# Patient Record
Sex: Female | Born: 1985 | Race: White | Hispanic: No | Marital: Married | Smoking: Former smoker
Health system: Southern US, Community
[De-identification: ages and names within clinical notes are randomized; demographics above are authoritative.]

## PROBLEM LIST (undated history)

## (undated) DIAGNOSIS — R519 Headache, unspecified: Secondary | ICD-10-CM

## (undated) DIAGNOSIS — R51 Headache: Secondary | ICD-10-CM

## (undated) HISTORY — PX: ECTOPIC PREGNANCY SURGERY: SHX613

## (undated) HISTORY — PX: TONSILLECTOMY: SUR1361

## (undated) HISTORY — DX: Headache: R51

## (undated) HISTORY — DX: Headache, unspecified: R51.9

---

## 2015-05-13 ENCOUNTER — Ambulatory Visit (INDEPENDENT_AMBULATORY_CARE_PROVIDER_SITE_OTHER): Payer: 59 | Admitting: Family Medicine

## 2015-05-13 ENCOUNTER — Encounter: Payer: Self-pay | Admitting: Family Medicine

## 2015-05-13 VITALS — BP 116/74 | HR 75 | Temp 98.2°F | Ht 62.0 in | Wt 134.8 lb

## 2015-05-13 DIAGNOSIS — G43009 Migraine without aura, not intractable, without status migrainosus: Secondary | ICD-10-CM | POA: Diagnosis not present

## 2015-05-13 DIAGNOSIS — F419 Anxiety disorder, unspecified: Secondary | ICD-10-CM

## 2015-05-13 DIAGNOSIS — G43909 Migraine, unspecified, not intractable, without status migrainosus: Secondary | ICD-10-CM | POA: Insufficient documentation

## 2015-05-13 MED ORDER — SERTRALINE HCL 50 MG PO TABS: ORAL_TABLET | ORAL | Status: DC

## 2015-05-13 MED ORDER — BUTALBITAL-APAP-CAFFEINE 50-325-40 MG PO TABS: 1.0000 | ORAL_TABLET | Freq: Two times a day (BID) | ORAL | Status: DC | PRN

## 2015-05-13 NOTE — Assessment & Plan Note (Signed)
Patient's headaches with migrainous and tension components. Neurologically intact. Suspect mild dizziness is related to migrainous nature. Notes headaches have been unchanged. No role for imaging at this time. Discussed possible treatment regimens for this. Discussed Imitrex, though with her starting on Zoloft this was not felt to be a great option due to risk of serotonin syndrome. She has responded in the past of Fioricet as an abortive therapy. We will start this on an as-needed basis. If she begins to need this more frequently she will let us know to consider alternative treatment. Given return precautions.

## 2015-05-13 NOTE — Assessment & Plan Note (Signed)
Patient with significant anxiety. No depression. No SI or HI. Discussed multiple treatment methods. We opted for SSRI, specifically Zoloft. She will take this as outlined below. She'll continue to monitor symptoms. I did advise that it could take at least 4-6 weeks for her to notice a difference. She is given return precautions. She'll follow-up in 4 weeks.

## 2015-05-13 NOTE — Progress Notes (Signed)
Patient ID: Elizabeth SandhoffSamantha Robbins, female   DOB: 07/31/1985, 30 y.o.   MRN: [id number]  Elizabeth AlarEric Luz Mares, MD Phone: [phone]  Elizabeth SandhoffSamantha Robbins is a 30 y.o. female who presents today for new patient visit.  Anxiety: Patient notes long history of anxiety. Worsened recently. She quit smoking in May 2016 then was placed on Wellbutrin. This helped significantly with her anxiety at that time. It helped relax her. She subsequently had switched to Chantix to help her quit smoking as her insurance would not cover Wellbutrin and her anxiety came back. She notes several life stressors recently. They moved, bought a house, and her husband started a new business of which he is now going to "demolish." Notes lots of changes in her life. She notes all day everyday she has an anxious feeling. No depression. No SI or HI.  Headaches: Patient has a long history of headaches. She notes headaches going back to when she was 30 years old. She was on Imitrex at that time. Notes she maybe grew out of her headaches at 30 years old though they have come back over the years. She was previously evaluated by her prior physician who tried a muscle relaxer at night that did not help with her headaches though helped with her sleep. States her headaches are stress induced. Notes a throbbing sensation on the top of her head. Positive photophobia and phonophobia. No numbness, weakness, or vision changes with this. Some nausea with this. Mild dizziness as well. Notes headaches go asleep and a heating pad. Gets headaches about once a week. She has previously been on Imitrex and Fioricet for this. She's also tried ibuprofen recently which has not been very beneficial.  Active Ambulatory Problems    Diagnosis Date Noted  . Anxiety 05/13/2015  . Migraines 05/13/2015   Resolved Ambulatory Problems    Diagnosis Date Noted  . No Resolved Ambulatory Problems   Past Medical History  Diagnosis Date  . Frequent headaches     Family History    Problem Relation Age of Onset  . Drug abuse Mother   . Breast cancer      Grandparent  . Lung cancer      Grandparent  . Stroke      Grandparent  . Hypertension      Grandparent  . Diabetes      Grandparent    Social History   Social History  . Marital Status: Married    Spouse Name: N/A  . Number of Children: N/A  . Years of Education: N/A   Occupational History  . Not on file.   Social History Main Topics  . Smoking status: Former Games developermoker  . Smokeless tobacco: Not on file  . Alcohol Use: 0.0 oz/week    0 Standard drinks or equivalent per week  . Drug Use: No  . Sexual Activity: Not on file   Other Topics Concern  . Not on file   Social History Narrative  . No narrative on file    ROS   General:  Negative for nexplained weight loss, fever Skin: Negative for new or changing mole, sore that won't heal HEENT: Negative for trouble hearing, trouble seeing, ringing in ears, mouth sores, hoarseness, change in voice, dysphagia. CV:  Negative for chest pain, dyspnea, edema, palpitations Resp: Negative for cough, dyspnea, hemoptysis GI: Negative for nausea, vomiting, diarrhea, constipation, abdominal pain, melena, hematochezia. GU: Negative for dysuria, incontinence, urinary hesitance, hematuria, vaginal or penile discharge, polyuria, sexual difficulty, lumps in testicle or breasts MSK:  Negative for muscle cramps or aches, joint pain or swelling Neuro: Positive for headaches, dizziness, Negative for weakness, numbness, passing out/fainting Psych: Positive for anxiety, stress, Negative for depression, memory problems  Objective  Physical Exam Filed Vitals:   05/13/15 0820  BP: 116/74  Pulse: 75  Temp: 98.2 F (36.8 C)    BP Readings from Last 3 Encounters:  05/13/15 116/74   Wt Readings from Last 3 Encounters:  05/13/15 134 lb 12.8 oz (61.145 kg)    Physical Exam  Constitutional: She is well-developed, well-nourished, and in no distress.  HENT:  Head:  Normocephalic and atraumatic.  Right Ear: External ear normal.  Left Ear: External ear normal.  Mouth/Throat: Oropharynx is clear and moist. No oropharyngeal exudate.  Eyes: Conjunctivae are normal. Pupils are equal, round, and reactive to light.  Neck: Neck supple.  Cardiovascular: Normal rate, regular rhythm and normal heart sounds.  Exam reveals no gallop and no friction rub.   No murmur heard. Pulmonary/Chest: Effort normal and breath sounds normal. No respiratory distress. She has no wheezes. She has no rales.  Abdominal: Soft. Bowel sounds are normal. She exhibits no distension. There is no tenderness. There is no rebound and no guarding.  Musculoskeletal: She exhibits no edema.  Lymphadenopathy:    She has no cervical adenopathy.  Neurological: She is alert.  CN 2-12 intact, 5/5 strength in bilateral biceps, triceps, grip, quads, hamstrings, plantar and dorsiflexion, sensation to light touch intact in bilateral UE and LE, normal gait, 2+ patellar reflexes, negative Romberg, no pronator drift  Skin: Skin is warm and dry. She is not diaphoretic.  Psychiatric:  Mood anxious, affect anxious     Assessment/Plan:   Anxiety Patient with significant anxiety. No depression. No SI or HI. Discussed multiple treatment methods. We opted for SSRI, specifically Zoloft. She will take this as outlined below. She'll continue to monitor symptoms. I did advise that it could take at least 4-6 weeks for her to notice a difference. She is given return precautions. She'll follow-up in 4 weeks.  Migraines Patient's headaches with migrainous and tension components. Neurologically intact. Suspect mild dizziness is related to migrainous nature. Notes headaches have been unchanged. No role for imaging at this time. Discussed possible treatment regimens for this. Discussed Imitrex, though with her starting on Zoloft this was not felt to be a great option due to risk of serotonin syndrome. She has responded in  the past of Fioricet as an abortive therapy. We will start this on an as-needed basis. If she begins to need this more frequently she will let us know to consider alternative treatment. Given return precautions.    Elizabeth Alar, MD [facility]

## 2015-05-13 NOTE — Patient Instructions (Signed)
Nice to meet you. We'll start you on Zoloft for your anxiety. We will start you on Fioricet for her headaches. Take this at the first sign of headache. If your headaches do not improve with this please let us know. If you develop worsening anxiety, depression, thoughts of harming herself or others, change in her headaches, persistent dizziness, numbness, weakness, vision changes, or any new or changing symptoms please seek medical attention.

## 2015-05-13 NOTE — Progress Notes (Signed)
Pre visit review using our clinic review tool, if applicable. No additional management support is needed unless otherwise documented below in the visit note. 

## 2015-06-17 ENCOUNTER — Ambulatory Visit (INDEPENDENT_AMBULATORY_CARE_PROVIDER_SITE_OTHER): Payer: 59 | Admitting: Family Medicine

## 2015-06-17 ENCOUNTER — Encounter: Payer: Self-pay | Admitting: Family Medicine

## 2015-06-17 VITALS — BP 112/76 | HR 107 | Temp 98.3°F | Ht 62.0 in | Wt 134.0 lb

## 2015-06-17 DIAGNOSIS — F419 Anxiety disorder, unspecified: Secondary | ICD-10-CM | POA: Diagnosis not present

## 2015-06-17 DIAGNOSIS — J069 Acute upper respiratory infection, unspecified: Secondary | ICD-10-CM | POA: Insufficient documentation

## 2015-06-17 DIAGNOSIS — G43009 Migraine without aura, not intractable, without status migrainosus: Secondary | ICD-10-CM | POA: Diagnosis not present

## 2015-06-17 MED ORDER — SERTRALINE HCL 25 MG PO TABS: 25.0000 mg | ORAL_TABLET | Freq: Every day | ORAL | Status: DC

## 2015-06-17 MED ORDER — AMOXICILLIN-POT CLAVULANATE 875-125 MG PO TABS: 1.0000 | ORAL_TABLET | Freq: Two times a day (BID) | ORAL | Status: DC

## 2015-06-17 MED ORDER — BENZONATATE 200 MG PO CAPS: 200.0000 mg | ORAL_CAPSULE | Freq: Two times a day (BID) | ORAL | Status: DC | PRN

## 2015-06-17 NOTE — Progress Notes (Signed)
Pre visit review using our clinic review tool, if applicable. No additional management support is needed unless otherwise documented below in the visit note. 

## 2015-06-17 NOTE — Assessment & Plan Note (Signed)
Improved from previously. Responding well to Fioricet. Neurologically intact. We'll continue abortive Fioricet. If she needs this more frequently she will let us know.

## 2015-06-17 NOTE — Progress Notes (Signed)
Patient ID: Elizabeth Robbins, female   DOB: [birth year], 30 y.o.   MRN: [id number]  Elizabeth AlarEric Kissa Campoy, MD Phone: [phone]  Elizabeth Robbins is a 30 y.o. female who presents today for follow-up.  Anxiety: Patient notes this is significantly improved. Has been taking 25 mg of Zoloft due to inability to tolerate 50 mg due to nausea and decreased appetite. Has had no issues recently with that after going back to 25 mg. No issues with anxiety at this time. No depression. No SI or HI.  Migraines: Notes she's had 2-3 migraines since her last visit. Takes the Fioricet as soon as she feels a migraine come on. Knocks it out within 10 minutes. No numbness, weakness, or vision changes with her migraines. Notes they're less frequent as well.  Sinus infection: Patient notes on Friday she had flu exposures. Notes she went had a flu test that was done at an urgent care that was negative. States she had fluid behind her left ear. Placed her on Atrovent nasal spray. She's been taking Sudafed. Cough started yesterday with some mild soreness in her ribs. Rumble to her cough that is nonproductive. No fevers though she has been having some sweats at night. Ears and popping. Frontal and maxillary sinuses a been sore and full. No shortness of breath. She last took pseudoephedrine this morning. Has had trouble sleeping at night due to the Sudafed.  PMH: Former smoker   ROS see history of present illness  Objective  Physical Exam Filed Vitals:   06/17/15 0759  BP: 112/76  Pulse: 107  Temp: 98.3 F (36.8 C)    BP Readings from Last 3 Encounters:  06/17/15 112/76  05/13/15 116/74   Wt Readings from Last 3 Encounters:  06/17/15 134 lb (60.782 kg)  05/13/15 134 lb 12.8 oz (61.145 kg)    Physical Exam  Constitutional: She is well-developed, well-nourished, and in no distress.  HENT:  Head: Normocephalic and atraumatic.  Right Ear: External ear normal.  Left Ear: External ear normal.  Mouth/Throat:  Oropharynx is clear and moist. No oropharyngeal exudate.  Normal TMs bilaterally  Eyes: Conjunctivae are normal. Pupils are equal, round, and reactive to light.  Neck: Neck supple.  Single palpable left anterior cervical lymph node that is nontender and is freely mobile  Cardiovascular: Normal heart sounds.   Tachycardic  Pulmonary/Chest: Effort normal and breath sounds normal.  Musculoskeletal: She exhibits no edema.  Neurological: She is alert.  CN 2-12 intact, 5/5 strength in bilateral biceps, triceps, grip, quads, hamstrings, plantar and dorsiflexion, sensation to light touch intact in bilateral UE and LE, normal gait, 2+ patellar reflexes  Skin: Skin is warm and dry. She is not diaphoretic.  Psychiatric: Mood and affect normal.     Assessment/Plan: Please see individual problem list.  Anxiety Significantly improved. Asymptomatic at this time. We will continue Zoloft 25 mg daily.  Migraines Improved from previously. Responding well to Fioricet. Neurologically intact. We'll continue abortive Fioricet. If she needs this more frequently she will let us know.  Acute upper respiratory infection Symptoms most consistent with viral upper respiratory infection given duration. Suspect mild tachycardia is related to the Sudafed that she has been taking around-the-clock for the last 2 days. Benign lung exam. Other vital signs are stable. Advised on over-the-counter Flonase and Claritin. Tessalon for cough. I did provide her with a prescription for Augmentin to fill in 2 days if she is not improving. Advised that if she worsens or develops new symptoms she needs to follow-up  instead of filling the antibiotic. She voiced understanding. She is given return precautions.    No orders of the defined types were placed in this encounter.    Meds ordered this encounter  Medications  . amoxicillin-clavulanate (AUGMENTIN) 875-125 MG tablet    Sig: Take 1 tablet by mouth 2 (two) times daily. Do not  fill until 06/19/15    Dispense:  14 tablet    Refill:  0  . sertraline (ZOLOFT) 25 MG tablet    Sig: Take 1 tablet (25 mg total) by mouth daily.    Dispense:  90 tablet    Refill:  1  . benzonatate (TESSALON) 200 MG capsule    Sig: Take 1 capsule (200 mg total) by mouth 2 (two) times daily as needed for cough.    Dispense:  20 capsule    Refill:  0    Elizabeth Alar, MD [facility]

## 2015-06-17 NOTE — Assessment & Plan Note (Signed)
Symptoms most consistent with viral upper respiratory infection given duration. Suspect mild tachycardia is related to the Sudafed that she has been taking around-the-clock for the last 2 days. Benign lung exam. Other vital signs are stable. Advised on over-the-counter Flonase and Claritin. Tessalon for cough. I did provide her with a prescription for Augmentin to fill in 2 days if she is not improving. Advised that if she worsens or develops new symptoms she needs to follow-up instead of filling the antibiotic. She voiced understanding. She is given return precautions.

## 2015-06-17 NOTE — Assessment & Plan Note (Signed)
Significantly improved. Asymptomatic at this time. We will continue Zoloft 25 mg daily.

## 2015-06-17 NOTE — Patient Instructions (Signed)
Nice to see you. I'm glad your anxiety is improved. You likely have a viral upper respiratory infection. You should use over-the-counter Claritin, Flonase, and pseudoephedrine to help with your symptoms. I have provided you with a prescription for Augmentin to fill in 2 days if your symptoms are not improving.  If your symptoms worsen or you develop new symptoms she should follow up prior to filling this antibiotic.  If you develop fevers, shortness of breath, cough productive of blood, worsening migraines, numbness, weakness, vision changes, worsening anxiety, depression, thoughts of harming herself or others, or any new or changing symptoms please seek medical attention.

## 2015-06-21 ENCOUNTER — Encounter: Payer: Self-pay | Admitting: Family Medicine

## 2015-06-21 DIAGNOSIS — E041 Nontoxic single thyroid nodule: Secondary | ICD-10-CM | POA: Insufficient documentation

## 2015-07-07 ENCOUNTER — Other Ambulatory Visit: Payer: Self-pay | Admitting: Family Medicine

## 2015-07-07 MED ORDER — BUTALBITAL-APAP-CAFFEINE 50-325-40 MG PO TABS: 1.0000 | ORAL_TABLET | Freq: Two times a day (BID) | ORAL | Status: DC | PRN

## 2015-07-07 NOTE — Telephone Encounter (Signed)
Refilled 3/17. Patient was unsure if you wanted to see her before refilling. Please advise?

## 2015-07-07 NOTE — Telephone Encounter (Signed)
Refill given. Please fax to pharmacy. 

## 2015-07-08 NOTE — Telephone Encounter (Signed)
jamie faxed

## 2015-08-18 ENCOUNTER — Other Ambulatory Visit: Payer: Self-pay | Admitting: Family Medicine

## 2015-08-18 MED ORDER — BUTALBITAL-APAP-CAFFEINE 50-325-40 MG PO TABS: 1.0000 | ORAL_TABLET | Freq: Two times a day (BID) | ORAL | Status: DC | PRN

## 2015-08-18 NOTE — Addendum Note (Signed)
Addended by: Glori LuisSONNENBERG, Petrona Wyeth G on: 08/18/2015 05:13 PM   Modules accepted: Orders

## 2015-08-18 NOTE — Telephone Encounter (Signed)
Refill given

## 2015-09-16 ENCOUNTER — Encounter: Payer: Self-pay | Admitting: Family Medicine

## 2015-09-16 ENCOUNTER — Ambulatory Visit (INDEPENDENT_AMBULATORY_CARE_PROVIDER_SITE_OTHER): Payer: 59 | Admitting: Family Medicine

## 2015-09-16 VITALS — BP 108/76 | HR 66 | Temp 98.1°F | Ht 62.0 in | Wt 140.0 lb

## 2015-09-16 DIAGNOSIS — E663 Overweight: Secondary | ICD-10-CM | POA: Diagnosis not present

## 2015-09-16 DIAGNOSIS — F419 Anxiety disorder, unspecified: Secondary | ICD-10-CM | POA: Diagnosis not present

## 2015-09-16 DIAGNOSIS — G43009 Migraine without aura, not intractable, without status migrainosus: Secondary | ICD-10-CM | POA: Diagnosis not present

## 2015-09-16 MED ORDER — NORTRIPTYLINE HCL 10 MG PO CAPS: 10.0000 mg | ORAL_CAPSULE | Freq: Every day | ORAL | Status: DC

## 2015-09-16 NOTE — Progress Notes (Signed)
  Elizabeth AlarEric Lott Seelbach, MD Phone: [phone]  [name] is a 30 y.o. female who presents today for f/u.  Anxiety: notes this is well controlled at this time. No anxiety. No depression. Is taking zoloft, though is interested in changing this given her migraines.   Migraines: Patient notes these have improved somewhat. She has been seeing a chiropractor and they did an x-ray that found may be a gap between her vertebrae. She's had several adjustments and after the first one she went 10 days without a migraine though the next migraine was a 10 out of 10. She then went 15 days after the next adjustment. Has been able to take mostly just Tylenol and ibuprofen for small headaches now. She is interested in switching nortriptyline because she had a friend that had good response to this. Does go through the Fioricet in about a month and a half.  Overweight: Patient notes she is interested in seeing a nutritionist to help her with her diet. She has poor dietary habits and eats pizza and other junk food. Has started to keep track of her exercise with the fit bit.  PMH: Former smoker   ROS see history of present illness  Objective  Physical Exam Filed Vitals:   09/16/15 0802  BP: 108/76  Pulse: 66  Temp: 98.1 F (36.7 C)    BP Readings from Last 3 Encounters:  09/16/15 108/76  06/17/15 112/76  05/13/15 116/74   Wt Readings from Last 3 Encounters:  09/16/15 140 lb (63.504 kg)  06/17/15 134 lb (60.782 kg)  05/13/15 134 lb 12.8 oz (61.145 kg)    Physical Exam  Constitutional: She is well-developed, well-nourished, and in no distress.  HENT:  Head: Normocephalic and atraumatic.  Cardiovascular: Normal rate, regular rhythm and normal heart sounds.   Pulmonary/Chest: Effort normal and breath sounds normal.  Neurological: She is alert.  CN 2-12 intact, 5/5 strength in bilateral biceps, triceps, grip, quads, hamstrings, plantar and dorsiflexion, sensation to light touch intact in  bilateral UE and LE, normal gait, 2+ patellar reflexes  Skin: Skin is warm and dry. She is not diaphoretic.  Psychiatric: Mood and affect normal.     Assessment/Plan: Please see individual problem list.  Migraines Has had some improvement with going to a chiropractor. She is interested in starting on a prophylactic medicine to treat this. Discussed nortriptyline with the patient. We will start her on this after she comes off of Zoloft. She will discontinue the Zoloft today and come off of it for the next week and then start the nortriptyline. Given return precautions.  Anxiety Well-controlled. She is interested in coming off Zoloft to try nortriptyline for her headaches. We'll discontinue Zoloft today and start nortriptyline in 1 week. Given return precautions.  Overweight (BMI 25.0-29.9) Referred to nutritionist.    Orders Placed This Encounter  Procedures  . Amb ref to Medical Nutrition Therapy-MNT    Referral Priority:  Routine    Referral Type:  Consultation    Referral Reason:  Specialty Services Required    Requested Specialty:  Nutrition    Number of Visits Requested:  1    Meds ordered this encounter  Medications  . nortriptyline (PAMELOR) 10 MG capsule    Sig: Take 1 capsule (10 mg total) by mouth at bedtime.    Dispense:  90 capsule    Refill:  1    Elizabeth AlarEric Jabaree Mercado, MD [facility] [place]

## 2015-09-16 NOTE — Assessment & Plan Note (Signed)
Has had some improvement with going to a chiropractor. She is interested in starting on a prophylactic medicine to treat this. Discussed nortriptyline with the patient. We will start her on this after she comes off of Zoloft. She will discontinue the Zoloft today and come off of it for the next week and then start the nortriptyline. Given return precautions.

## 2015-09-16 NOTE — Assessment & Plan Note (Signed)
Referred to nutritionist  

## 2015-09-16 NOTE — Patient Instructions (Signed)
Nice to see you. We will have you discontinued her Zoloft today. She should stay off of this for the next week. Once you are off of that for a week you can start the nortriptyline. Please continue to monitor your migraines. We will refer you to nutritionist. If you develop numbness, weakness, vision changes, worsening headaches, worsening anxiety, or any new or change in symptoms please seek medical attention.

## 2015-09-16 NOTE — Assessment & Plan Note (Signed)
Well-controlled. She is interested in coming off Zoloft to try nortriptyline for her headaches. We'll discontinue Zoloft today and start nortriptyline in 1 week. Given return precautions.

## 2015-09-16 NOTE — Progress Notes (Signed)
Pre visit review using our clinic review tool, if applicable. No additional management support is needed unless otherwise documented below in the visit note. 

## 2015-09-23 ENCOUNTER — Encounter: Payer: Self-pay | Admitting: Family Medicine

## 2015-09-29 ENCOUNTER — Encounter: Payer: Self-pay | Admitting: Family Medicine

## 2015-10-08 ENCOUNTER — Ambulatory Visit: Payer: 59 | Admitting: Dietician

## 2015-10-08 ENCOUNTER — Other Ambulatory Visit: Payer: Self-pay | Admitting: Family Medicine

## 2015-10-08 ENCOUNTER — Encounter: Payer: Self-pay | Admitting: Family Medicine

## 2015-10-08 MED ORDER — NORTRIPTYLINE HCL 10 MG PO CAPS: 10.0000 mg | ORAL_CAPSULE | Freq: Two times a day (BID) | ORAL | 1 refills | Status: DC

## 2015-11-06 ENCOUNTER — Ambulatory Visit (INDEPENDENT_AMBULATORY_CARE_PROVIDER_SITE_OTHER): Payer: 59 | Admitting: Family Medicine

## 2015-11-06 ENCOUNTER — Encounter: Payer: Self-pay | Admitting: Family Medicine

## 2015-11-06 DIAGNOSIS — E663 Overweight: Secondary | ICD-10-CM

## 2015-11-06 DIAGNOSIS — F419 Anxiety disorder, unspecified: Secondary | ICD-10-CM | POA: Diagnosis not present

## 2015-11-06 DIAGNOSIS — G43009 Migraine without aura, not intractable, without status migrainosus: Secondary | ICD-10-CM | POA: Diagnosis not present

## 2015-11-06 NOTE — Progress Notes (Signed)
Pre visit review using our clinic review tool, if applicable. No additional management support is needed unless otherwise documented below in the visit note. 

## 2015-11-06 NOTE — Progress Notes (Signed)
  Elizabeth AlarEric Sonnenberg, MD Phone: [phone]  Elizabeth SandhoffSamantha Robbins is a 30 y.o. female who presents today for follow-up.  Anxiety: Patient notes this is somewhat better than before though not as good as when she was on Zoloft. She notes increasing the nortriptyline to twice daily has been somewhat beneficial. Still gets moody and defensive easily. Does feel l a ittle more emotional than usual though not depressed. Is interested in the next step in treatment.  Migraines: Has not had a single migraine since starting the increased dose of nortriptyline. Has not had any drowsiness with the nortriptyline. Notes she had 1 tension headache 1 week ago though no migraines. No vision changes, numbness, or weakness.  Overweight: She is down about a pound. Her clothes are fitting significantly better. She has been working out by going to the gym 3-5 days a week for the last 7 weeks. Working on diet by doing meal prep with lean meats and vegetables.  PMH: Former smoker   ROS see history of present illness  Objective  Physical Exam Vitals:   11/06/15 0759  BP: 108/64  Pulse: 78  Temp: 98.4 F (36.9 C)    BP Readings from Last 3 Encounters:  11/06/15 108/64  09/16/15 108/76  06/17/15 112/76   Wt Readings from Last 3 Encounters:  11/06/15 139 lb (63 kg)  09/16/15 140 lb (63.5 kg)  06/17/15 134 lb (60.8 kg)    Physical Exam  Constitutional: No distress.  Cardiovascular: Normal rate, regular rhythm and normal heart sounds.   Pulmonary/Chest: Effort normal and breath sounds normal.  Skin: She is not diaphoretic.     Assessment/Plan: Please see individual problem list.  Migraines Well-controlled with her current dose of nortriptyline. She'll continue her current dose of nortriptyline. If migraines recur or become more frequent she'll let us know.  Anxiety Not as well controlled on nortriptyline. Discussed next step in management. Patient will contact a therapist from the list of names  provided. If none of them take her insurance she will let us know. Could consider addition of BuSpar if not improving with therapy.  Overweight (BMI 25.0-29.9) Notes some changes in how her clothes fit. Down about a pound by our scale. Encouraged continuing to exercise and monitor her diet.   Elizabeth AlarEric Sonnenberg, MD [facility] [place]

## 2015-11-06 NOTE — Assessment & Plan Note (Signed)
Not as well controlled on nortriptyline. Discussed next step in management. Patient will contact a therapist from the list of names provided. If none of them take her insurance she will let us know. Could consider addition of BuSpar if not improving with therapy.

## 2015-11-06 NOTE — Assessment & Plan Note (Signed)
Notes some changes in how her clothes fit. Down about a pound by our scale. Encouraged continuing to exercise and monitor her diet.

## 2015-11-06 NOTE — Patient Instructions (Signed)
Nice to see you. Please continue your nortriptyline. Monitor for recurrence of your migraines.  Please contact one of the therapists on the list provided. Please continue to go to the gym and work on your diet. If you develop worsening anxiety, or develop depression, recurrence of migraines, or any new symptoms please let us know.

## 2015-11-06 NOTE — Assessment & Plan Note (Signed)
Well-controlled with her current dose of nortriptyline. She'll continue her current dose of nortriptyline. If migraines recur or become more frequent she'll let us know.

## 2015-11-11 ENCOUNTER — Encounter: Payer: Self-pay | Admitting: Family Medicine

## 2015-11-13 ENCOUNTER — Encounter: Payer: Self-pay | Admitting: Family Medicine

## 2015-11-13 ENCOUNTER — Other Ambulatory Visit: Payer: Self-pay | Admitting: Family Medicine

## 2015-11-13 MED ORDER — BUSPIRONE HCL 5 MG PO TABS: 5.0000 mg | ORAL_TABLET | Freq: Two times a day (BID) | ORAL | 2 refills | Status: DC

## 2015-12-02 ENCOUNTER — Encounter: Payer: Self-pay | Admitting: Family Medicine

## 2016-02-06 ENCOUNTER — Encounter: Payer: Self-pay | Admitting: Family Medicine

## 2016-02-06 ENCOUNTER — Ambulatory Visit (INDEPENDENT_AMBULATORY_CARE_PROVIDER_SITE_OTHER): Payer: 59 | Admitting: Family Medicine

## 2016-02-06 DIAGNOSIS — J069 Acute upper respiratory infection, unspecified: Secondary | ICD-10-CM

## 2016-02-06 DIAGNOSIS — F419 Anxiety disorder, unspecified: Secondary | ICD-10-CM

## 2016-02-06 DIAGNOSIS — E663 Overweight: Secondary | ICD-10-CM

## 2016-02-06 DIAGNOSIS — G43009 Migraine without aura, not intractable, without status migrainosus: Secondary | ICD-10-CM

## 2016-02-06 NOTE — Progress Notes (Signed)
  Elizabeth AlarEric Johanna Matto, MD Phone: [phone]  [name] is a 30 y.o. female who presents today for follow-up.  Patient's anxiety is well controlled. No anxiety at this time. No depression. Currently taking nortriptyline at night. Also taking BuSpar. The BuSpar made a huge difference.  Patient notes a sore throat for the last 2 days. Some postnasal drip. Some ear fullness as well. No fevers.  Overweight: Patient has fallen off with exercise. Has not been going over the last 4 weeks. She also has been stress eating at times. She is getting back into exercise in January. She would like to see a nutritionist at that time.  Migraines: Very well controlled now. Gets a migraine once every 2 months. Takes Excedrin when she gets them and that helps. No numbness, weakness, or vision changes.  PMH: Former smoker  ROS see history of present illness  Objective  Physical Exam Vitals:   02/06/16 0758  BP: 128/82  Pulse: 84  Temp: 98.1 F (36.7 C)    BP Readings from Last 3 Encounters:  02/06/16 128/82  11/06/15 108/64  09/16/15 108/76   Wt Readings from Last 3 Encounters:  02/06/16 143 lb 12.8 oz (65.2 kg)  11/06/15 139 lb (63 kg)  09/16/15 140 lb (63.5 kg)    Physical Exam  Constitutional: No distress.  HENT:  Head: Normocephalic and atraumatic.  Mouth/Throat: Oropharynx is clear and moist. No oropharyngeal exudate.  Normal TMs bilaterally  Eyes: Conjunctivae are normal. Pupils are equal, round, and reactive to light.  Cardiovascular: Normal rate, regular rhythm and normal heart sounds.   Pulmonary/Chest: Effort normal and breath sounds normal.  Neurological: She is alert.  CN 2-12 intact, 5/5 strength in bilateral biceps, triceps, grip, quads, hamstrings, plantar and dorsiflexion, sensation to light touch intact in bilateral UE and LE, normal gait  Skin: Skin is warm and dry. She is not diaphoretic.     Assessment/Plan: Please see individual problem  list.  Migraines Well-controlled. Continue nortriptyline.  Acute upper respiratory infection Patient's symptoms most consistent with viral illness. She'll continue to monitor. If worsen she'll let us know.  Overweight (BMI 25.0-29.9) Weight is back up a little bit. Discussed diet and exercise. She is going to start exercising again in January. Discussed referral to nutritionist locally or Dr. Gerilyn PilgrimSykes in [place]. She will let us know in January when she would like to do.  Anxiety Well-controlled at this time. Continue current medications.   Elizabeth AlarEric Elizabeth Schrager, MD [facility]

## 2016-02-06 NOTE — Assessment & Plan Note (Signed)
Patient's symptoms most consistent with viral illness. She'll continue to monitor. If worsen she'll let us know.

## 2016-02-06 NOTE — Progress Notes (Signed)
Pre visit review using our clinic review tool, if applicable. No additional management support is needed unless otherwise documented below in the visit note. 

## 2016-02-06 NOTE — Assessment & Plan Note (Signed)
Weight is back up a little bit. Discussed diet and exercise. She is going to start exercising again in January. Discussed referral to nutritionist locally or Dr. Gerilyn PilgrimSykes in [place]. She will let us know in January when she would like to do.

## 2016-02-06 NOTE — Assessment & Plan Note (Signed)
Well-controlled. Continue nortriptyline.

## 2016-02-06 NOTE — Assessment & Plan Note (Signed)
Well controlled at this time.  Continue current medications.   

## 2016-02-06 NOTE — Patient Instructions (Signed)
Nice to see you. Please continue your nortriptyline and BuSpar. In the new year please start back with exercise. Please send a my chart message when you decide you want to see a nutritionist. If your sore throat becomes worse or you develop any fevers please let us know.

## 2016-02-13 ENCOUNTER — Encounter: Payer: Self-pay | Admitting: Family Medicine

## 2016-03-16 ENCOUNTER — Encounter: Payer: Self-pay | Admitting: Family Medicine

## 2016-03-16 NOTE — Telephone Encounter (Signed)
Records not in chart

## 2016-04-19 ENCOUNTER — Encounter: Payer: Self-pay | Admitting: Family Medicine

## 2016-04-19 ENCOUNTER — Other Ambulatory Visit: Payer: Self-pay | Admitting: Family Medicine

## 2016-04-19 MED ORDER — OSELTAMIVIR PHOSPHATE 75 MG PO CAPS
75.0000 mg | ORAL_CAPSULE | Freq: Every day | ORAL | 0 refills | Status: DC
Start: 2016-04-19 — End: 2016-09-24

## 2016-04-21 ENCOUNTER — Other Ambulatory Visit: Payer: Self-pay | Admitting: Family Medicine

## 2016-04-21 MED ORDER — BUSPIRONE HCL 5 MG PO TABS: 5.0000 mg | ORAL_TABLET | Freq: Two times a day (BID) | ORAL | 2 refills | Status: DC

## 2016-04-21 NOTE — Telephone Encounter (Signed)
Last OV 02/06/16 last filled 11/13/15 60 2rf

## 2016-04-21 NOTE — Telephone Encounter (Signed)
Pt called needing a refill for busPIRone (BUSPAR) 5 MG tablet.  Pharmacy is [id number] - [place], [place] - [address]  Call Pt @ [phone]. Thank you!

## 2016-06-18 ENCOUNTER — Encounter: Payer: Self-pay | Admitting: Family Medicine

## 2016-06-18 ENCOUNTER — Other Ambulatory Visit: Payer: Self-pay | Admitting: Family Medicine

## 2016-06-18 MED ORDER — NORTRIPTYLINE HCL 10 MG PO CAPS: 10.0000 mg | ORAL_CAPSULE | Freq: Two times a day (BID) | ORAL | 1 refills | Status: DC

## 2016-06-21 ENCOUNTER — Encounter: Payer: Self-pay | Admitting: Family Medicine

## 2016-07-05 ENCOUNTER — Encounter: Payer: Self-pay | Admitting: Family Medicine

## 2016-07-05 MED ORDER — NORTRIPTYLINE HCL 10 MG PO CAPS: 10.0000 mg | ORAL_CAPSULE | Freq: Two times a day (BID) | ORAL | 1 refills | Status: AC

## 2016-07-05 MED ORDER — BUSPIRONE HCL 5 MG PO TABS: 5.0000 mg | ORAL_TABLET | Freq: Two times a day (BID) | ORAL | 2 refills | Status: DC

## 2016-08-03 ENCOUNTER — Encounter: Payer: Self-pay | Admitting: Family Medicine

## 2016-08-06 ENCOUNTER — Ambulatory Visit: Payer: 59 | Admitting: Family Medicine

## 2016-08-19 ENCOUNTER — Encounter: Payer: Self-pay | Admitting: Family Medicine

## 2016-08-19 MED ORDER — JUNEL FE 1.5/30 1.5-30 MG-MCG PO TABS: ORAL_TABLET | ORAL | 3 refills | Status: DC

## 2016-08-19 NOTE — Telephone Encounter (Signed)
Filed under historical 

## 2016-08-19 NOTE — Telephone Encounter (Signed)
Please contact the patient and see who has prescribed her OCP previously. With her history of migraines OCPs are not the preferred method of birth control given a risk for stroke in patients with migraines. Please see how long she has been on this and see if she has discussed this with an OBGYN previously. Thanks.  Prescription initially sent to pharmacy though once I noted the migraine history I called the pharmacy and canceled this prescription until we get further information from the patient.

## 2016-08-19 NOTE — Telephone Encounter (Signed)
Patient states her previous pcp prescribed these. Patient has been on the birth control for 3-4 year. She also states the nortriptyline had reduced her migraines to 1 episode every 3-4 weeks from 1-2 weekly before.Patient is ok with switching if recommended.

## 2016-08-20 NOTE — Telephone Encounter (Signed)
Pt called back returning your call. Please advise, thank you!  Call pt @ [phone]

## 2016-08-20 NOTE — Telephone Encounter (Signed)
Patient states she does not have any vision changes with her migraines

## 2016-08-20 NOTE — Telephone Encounter (Signed)
We could consider a progesterone only pill or she could consider injectable birth control, an implantable birth control, or an IUD. The important aspect in each of these is the lack of estrogen. We could provide her with information on each and she could then decide. The implanon or IUD would require a referral to GYN for placement.

## 2016-08-20 NOTE — Telephone Encounter (Signed)
Noted. Please check with the patient to see if she has a aura prior to her migraines. This could be flashing lights, abnormal vision, or halos or other vision changes. This will help determine what the best option for birth control would be. Thanks.

## 2016-08-20 NOTE — Telephone Encounter (Signed)
Left message to return call 

## 2016-08-23 ENCOUNTER — Telehealth: Payer: Self-pay | Admitting: Family Medicine

## 2016-08-23 NOTE — Telephone Encounter (Signed)
Left message to return call 

## 2016-08-23 NOTE — Telephone Encounter (Signed)
Pt called back returning your call. Thank you! °

## 2016-08-24 NOTE — Telephone Encounter (Signed)
Patient states she is ok with a progesterone only pill but they are usually not covered. I can research prices once you inform me of what pills

## 2016-08-24 NOTE — Telephone Encounter (Signed)
It appears that the only progesterone only pill marketed in the [place] is norethindrone. Please see how much this costs. Thanks.

## 2016-08-25 MED ORDER — NORETHINDRONE 0.35 MG PO TABS: 1.0000 | ORAL_TABLET | Freq: Every day | ORAL | 11 refills | Status: DC

## 2016-08-25 NOTE — Telephone Encounter (Signed)
Patient would like to switch to norethindrone please send to [facility]

## 2016-08-25 NOTE — Telephone Encounter (Signed)
Pt called back returning your call.   Call pt @ [phone] (this is pt's work number just ask for her)

## 2016-08-25 NOTE — Telephone Encounter (Signed)
Patient notified

## 2016-08-25 NOTE — Telephone Encounter (Signed)
Left message to return call, [facility] has a no inusrance no coupon price of $9

## 2016-08-25 NOTE — Telephone Encounter (Signed)
Sent pharmacy. Please relay the following instructions. Thanks.   Start norethindrone on first day of menstrual period or given that she is switching from a combined OCP she could begin the day after finishing the last active combined tablet. If she has been without the combined OCP she should start it on the first day of her next menstrual cycle. If she has been off of her OCP and she would like to start it prior to the first day of her menstrual cycle we will need to check a pregnancy test.

## 2016-09-21 ENCOUNTER — Encounter: Payer: Self-pay | Admitting: Family Medicine

## 2016-09-24 ENCOUNTER — Ambulatory Visit (INDEPENDENT_AMBULATORY_CARE_PROVIDER_SITE_OTHER): Payer: 59 | Admitting: Family Medicine

## 2016-09-24 VITALS — BP 116/86 | HR 114 | Temp 98.2°F | Ht 62.0 in | Wt 139.8 lb

## 2016-09-24 DIAGNOSIS — F419 Anxiety disorder, unspecified: Secondary | ICD-10-CM | POA: Diagnosis not present

## 2016-09-24 DIAGNOSIS — K625 Hemorrhage of anus and rectum: Secondary | ICD-10-CM | POA: Insufficient documentation

## 2016-09-24 DIAGNOSIS — E041 Nontoxic single thyroid nodule: Secondary | ICD-10-CM | POA: Diagnosis not present

## 2016-09-24 DIAGNOSIS — Z3041 Encounter for surveillance of contraceptive pills: Secondary | ICD-10-CM | POA: Diagnosis not present

## 2016-09-24 LAB — CBC
HCT: 43.8 % (ref 36.0–46.0)
Hemoglobin: 14.6 g/dL (ref 12.0–15.0)
MCHC: 33.4 g/dL (ref 30.0–36.0)
MCV: 87.2 fl (ref 78.0–100.0)
Platelets: 228 10*3/uL (ref 150.0–400.0)
RBC: 5.02 Mil/uL (ref 3.87–5.11)
RDW: 12 % (ref 11.5–15.5)
WBC: 7.3 10*3/uL (ref 4.0–10.5)

## 2016-09-24 LAB — COMPREHENSIVE METABOLIC PANEL
ALBUMIN: 4.5 g/dL (ref 3.5–5.2)
ALK PHOS: 49 U/L (ref 39–117)
ALT: 18 U/L (ref 0–35)
AST: 17 U/L (ref 0–37)
BILIRUBIN TOTAL: 0.6 mg/dL (ref 0.2–1.2)
BUN: 18 mg/dL (ref 6–23)
CO2: 26 mEq/L (ref 19–32)
CREATININE: 0.75 mg/dL (ref 0.40–1.20)
Calcium: 9.3 mg/dL (ref 8.4–10.5)
Chloride: 107 mEq/L (ref 96–112)
GFR: 95.72 mL/min (ref >60.00)
GLUCOSE: 98 mg/dL (ref 70–99)
Potassium: 4.5 mEq/L (ref 3.5–5.1)
SODIUM: 140 meq/L (ref 135–145)
TOTAL PROTEIN: 7 g/dL (ref 6.0–8.3)

## 2016-09-24 LAB — TSH: TSH: 1.24 u[IU]/mL (ref 0.35–4.50)

## 2016-09-24 NOTE — Progress Notes (Signed)
Tommi Rumps, MD Phone: [phone]  Elizabeth Robbins is a 31 y.o. female who presents today for follow-up.  Anxiety: Notes this is good. On BuSpar and nortriptyline. No depression. 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 She does note some anxiety today regarding discussing possible hemorrhoids.  She has tolerated the progesterone only OCP. Notes the cost is free. She notes her menstrual cycle has been the same. She notes her migraines have improved quite a bit since coming off the estrogen OCP.  Thyroid nodule: No growth in her neck. She is due for follow-up on this.  She notes on several occasions over the last month or so she has felt as though she's had hemorrhoids. Initially started with itching and burning. Feels as though they may be internal and she has no external lumps. She tried over-the-counter medications and got rid of the itching and burning though has had occasional bleeding on 3 occasions with some bright red blood per rectum. Normal colored stool. No family history of colon cancer. No abdominal pain.  PMH: Former smoker   ROS see history of present illness  Objective  Physical Exam Vitals:   09/24/16 0809  BP: 116/86  Pulse: (!) 114  Temp: 98.2 F (36.8 C)    BP Readings from Last 3 Encounters:  09/24/16 116/86  02/06/16 128/82  11/06/15 108/64   Wt Readings from Last 3 Encounters:  09/24/16 139 lb 12.8 oz (63.4 kg)  02/06/16 143 lb 12.8 oz (65.2 kg)  11/06/15 139 lb (63 kg)    Physical Exam  Constitutional: No distress.  Neck: No thyromegaly (no thyroid nodules palpated) present.  Cardiovascular: Regular rhythm and normal heart sounds.  Tachycardia present.   Pulmonary/Chest: Effort normal and breath sounds normal.  Musculoskeletal: She exhibits no edema.  Neurological: She is alert. Gait normal.  Skin: Skin is warm and dry. She is not diaphoretic.     Assessment/Plan: Please see individual  problem list.  Thyroid nodule Due for repeat ultrasound. We'll check a TSH as well.  Anxiety Well-controlled. Continue current medications. Suspect some anxiety today leading to her tachycardia. Patient does note some anxiety surrounding discussing the possibility of hemorrhoids. She reports her pulse is typically in the 80s on her apple watch. We'll check lab work as outlined below to evaluate other causes of tachycardia.  BRBPR (bright red blood per rectum) Several episodes of bright red blood per rectum. Discussed doing a rectal exam today though the patient declined this. We'll have her see GI for evaluation. Referral has been placed. We'll have her do stool cards. We'll check a CBC.  Uses oral contraception Has been doing well on progesterone only OCP. Menstrual cycle is regular same. Migraines and actually improved with this. She'll continue current OCP.   Orders Placed This Encounter  Procedures  . Fecal occult blood, imunochemical    Standing Status:   Future    Standing Expiration Date:   09/24/2017  . US THYROID    Standing Status:   Future    Standing Expiration Date:   11/25/2017    Order Specific Question:   Reason for Exam (SYMPTOM  OR DIAGNOSIS REQUIRED)    Answer:   follow-up on previous thyroid nodule    Order Specific Question:   Preferred imaging location?    Answer:   [facility]  . TSH  . CBC  . Comp Met (CMET)  . Ambulatory referral to Gastroenterology    Referral Priority:  Routine    Referral Type:   Consultation    Referral Reason:   Specialty Services Required    Number of Visits Requested:   [place], MD [place]

## 2016-09-24 NOTE — Assessment & Plan Note (Signed)
Due for repeat ultrasound. We'll check a TSH as well.

## 2016-09-24 NOTE — Patient Instructions (Signed)
Nice to see you. I glad your anxiety is doing well. Please continue to monitor this and your migraines. If your migraines worsen please let us know.  I will get you set up for an ultrasound of your thyroid. We will get you see GI for your possible hemorrhoids.

## 2016-09-24 NOTE — Progress Notes (Signed)
Pre visit review using our clinic review tool, if applicable. No additional management support is needed unless otherwise documented below in the visit note. 

## 2016-09-24 NOTE — Assessment & Plan Note (Signed)
Has been doing well on progesterone only OCP. Menstrual cycle is regular same. Migraines and actually improved with this. She'll continue current OCP.

## 2016-09-24 NOTE — Assessment & Plan Note (Addendum)
Well-controlled. Continue current medications. Suspect some anxiety today leading to her tachycardia. Patient does note some anxiety surrounding discussing the possibility of hemorrhoids. She reports her pulse is typically in the 80s on her apple watch. We'll check lab work as outlined below to evaluate other causes of tachycardia.

## 2016-09-24 NOTE — Assessment & Plan Note (Signed)
Several episodes of bright red blood per rectum. Discussed doing a rectal exam today though the patient declined this. We'll have her see GI for evaluation. Referral has been placed. We'll have her do stool cards. We'll check a CBC.

## 2016-09-30 ENCOUNTER — Ambulatory Visit: Admission: RE | Admit: 2016-09-30 | Payer: 59 | Source: Ambulatory Visit

## 2016-10-15 ENCOUNTER — Ambulatory Visit
Admission: RE | Admit: 2016-10-15 | Discharge: 2016-10-15 | Disposition: A | Discharge status: Home / Self Care | Payer: 59 | Source: Ambulatory Visit | Attending: Family Medicine | Admitting: Family Medicine

## 2016-10-15 DIAGNOSIS — E041 Nontoxic single thyroid nodule: Secondary | ICD-10-CM | POA: Insufficient documentation

## 2017-02-11 ENCOUNTER — Other Ambulatory Visit: Payer: Self-pay | Admitting: Family Medicine

## 2017-03-23 ENCOUNTER — Encounter: Payer: Self-pay | Admitting: Family Medicine

## 2017-03-24 ENCOUNTER — Telehealth: Payer: Self-pay

## 2017-03-24 NOTE — Telephone Encounter (Signed)
Sent mychart message

## 2017-03-31 ENCOUNTER — Ambulatory Visit: Payer: 59 | Admitting: Family Medicine

## 2017-05-08 ENCOUNTER — Other Ambulatory Visit: Payer: Self-pay | Admitting: Family Medicine

## 2017-07-29 ENCOUNTER — Other Ambulatory Visit: Payer: Self-pay | Admitting: Family Medicine

## 2017-08-22 ENCOUNTER — Other Ambulatory Visit: Payer: Self-pay | Admitting: Family Medicine

## 2017-12-11 IMAGING — US US THYROID
1 series · 13 of 25 positions shown · non-contrast
Comparison: None.

CLINICAL DATA: Evaluate thyroid nodule.

EXAM:
THYROID ULTRASOUND
TECHNIQUE: Ultrasound examination of the thyroid gland and adjacent soft
tissues was performed.

[Series 1: us thyroid · 0.07mm/px · 13 of 51 slices shown]
[im 1/51]
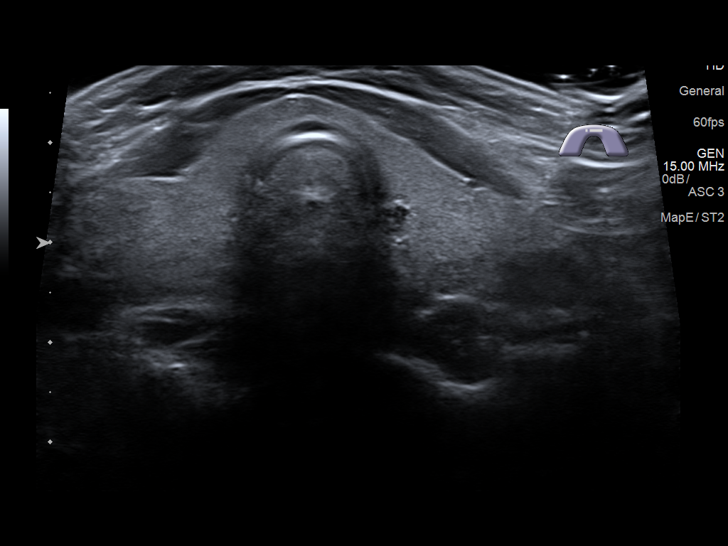
[im 5/51]
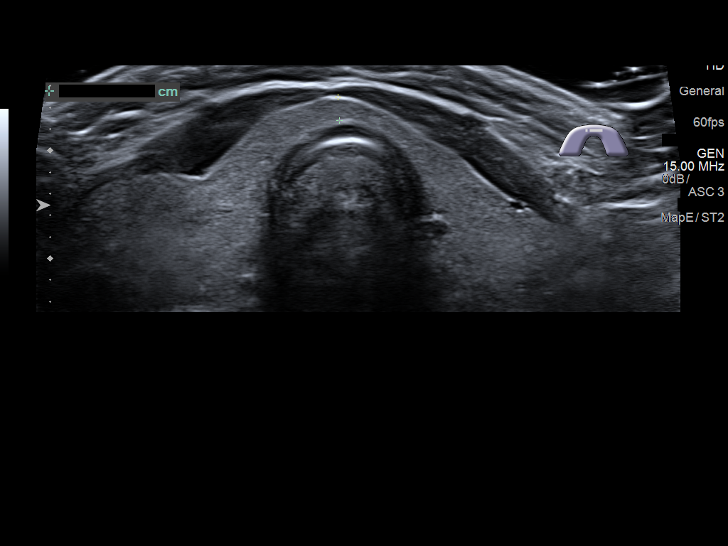
[im 9/51]
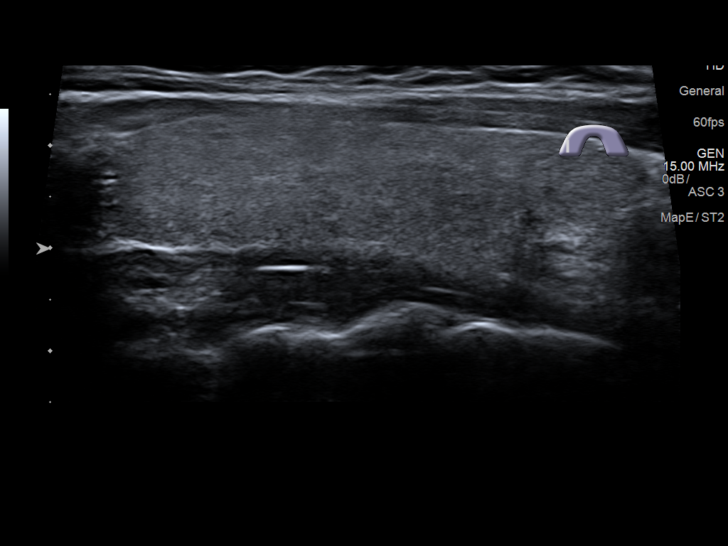
[im 13/51]
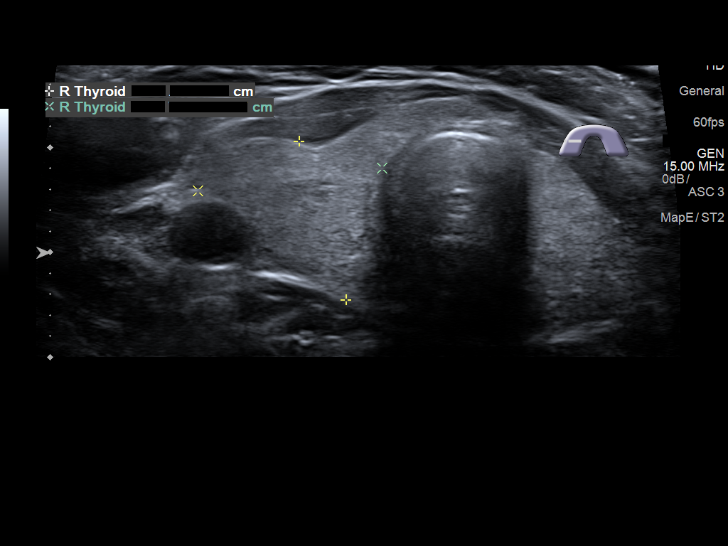
[im 17/51]
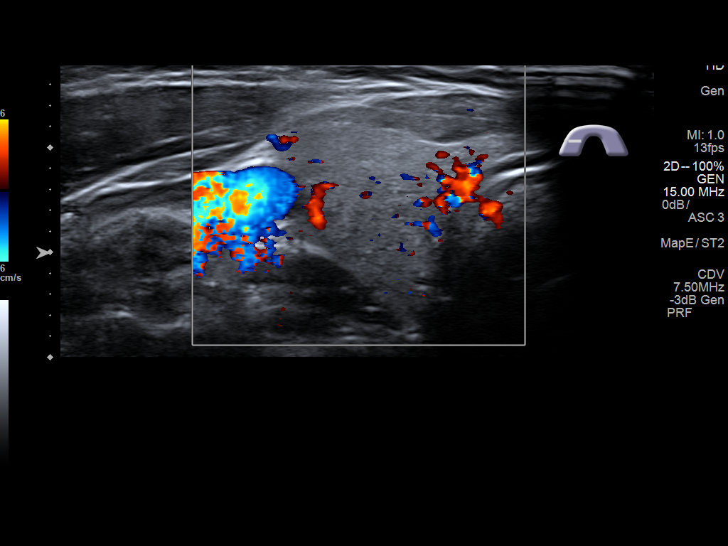
[im 21/51]
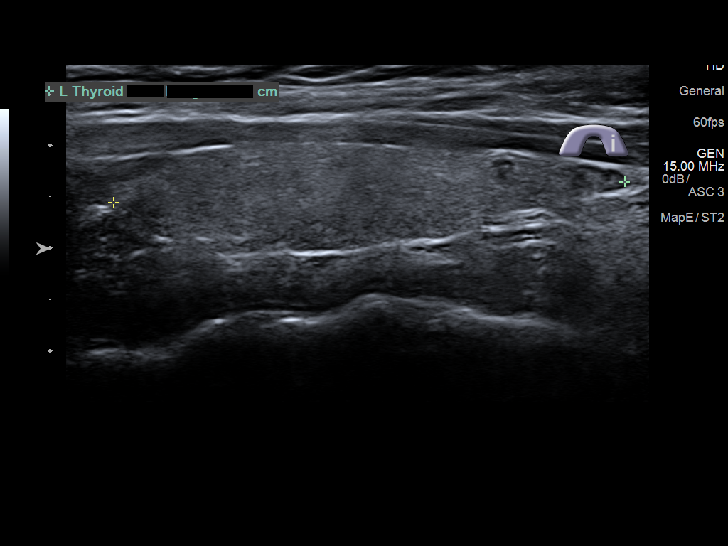
[im 26/51]
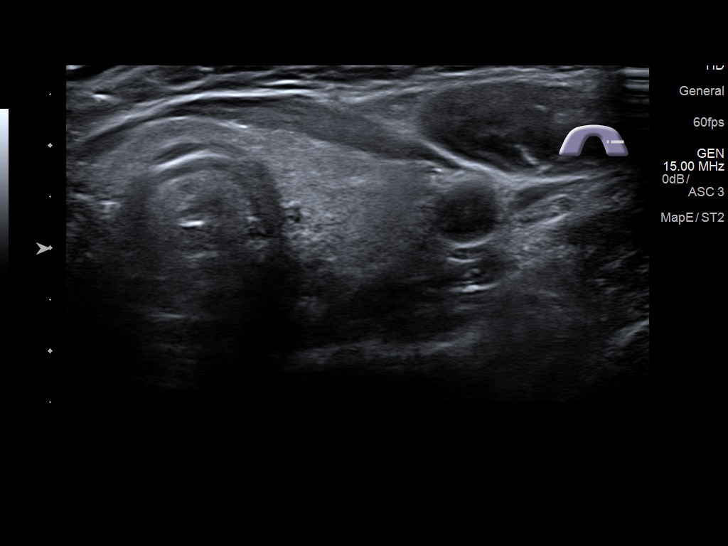
[im 30/51]
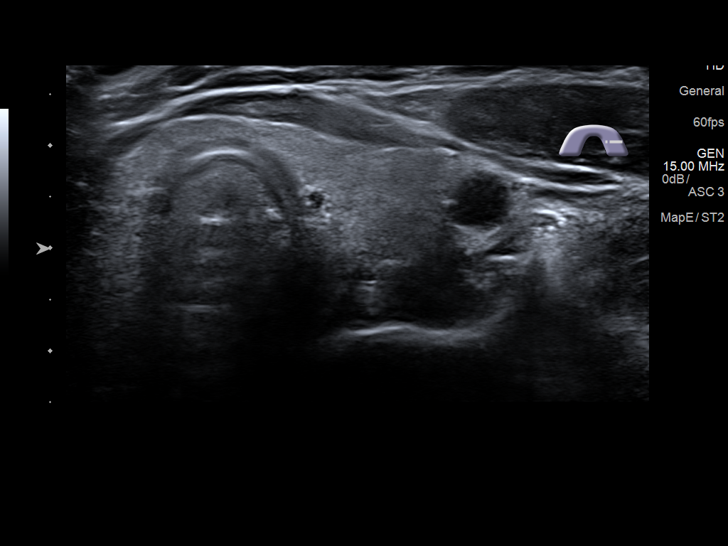
[im 34/51]
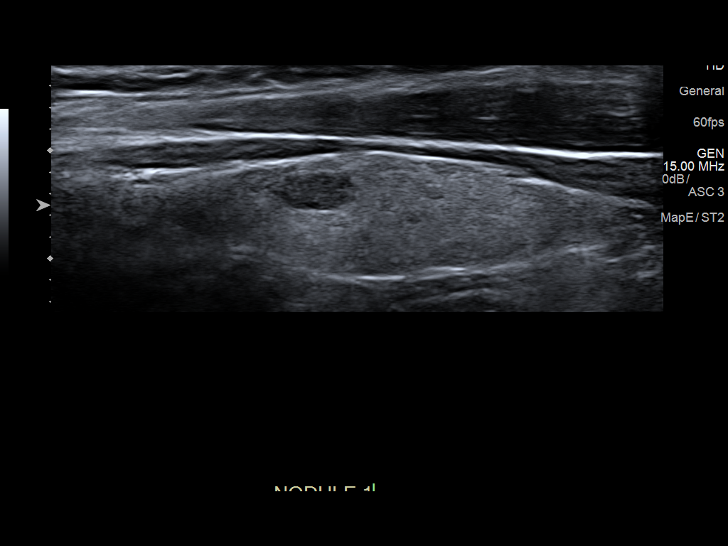
[im 38/51]
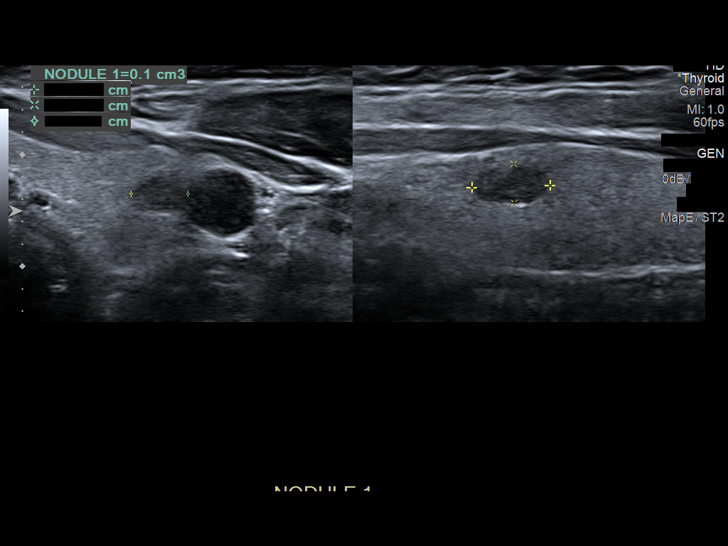
[im 42/51]
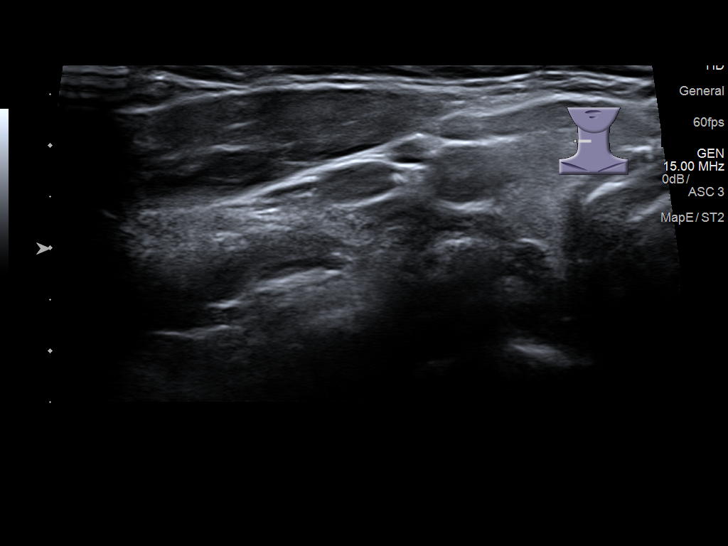
[im 46/51]
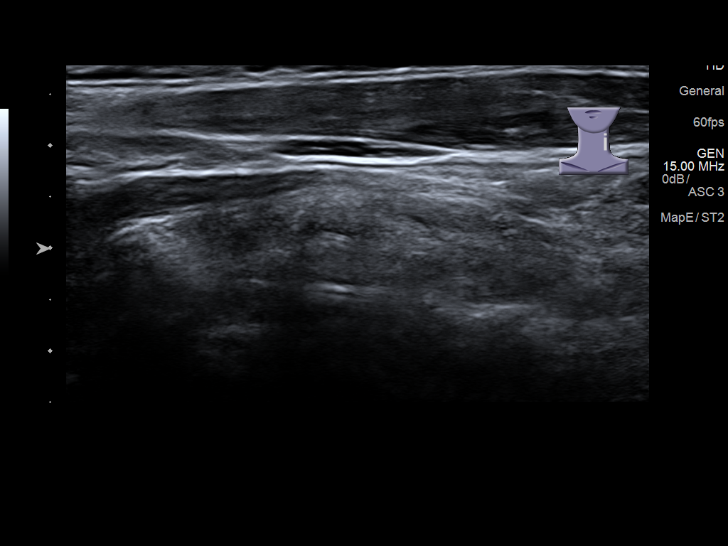
[im 51/51]
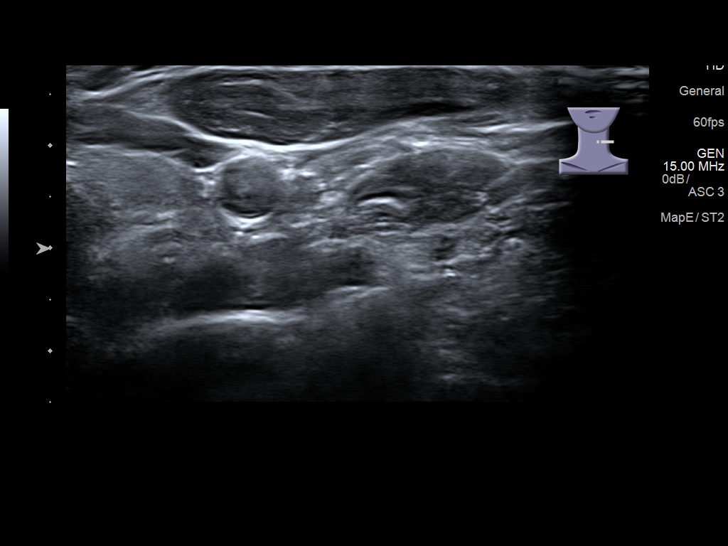

[13 of 25 positions shown; findings below may reference images not displayed]

FINDINGS: Parenchymal Echotexture: Normal

Isthmus: 0.2 cm

Right lobe: 5.2 x 1.6 x 1.8 cm

Left lobe: 5.0 x 1.5 x 1.7 cm

_________________________________________________________

Estimated total number of nodules >/= 1 cm: 0

Number of spongiform nodules >/=  2 cm not described below (TR1): 0

Number of mixed cystic and solid nodules >/= 1.5 cm not described
below (TR2): 0

_________________________________________________________

Nodule # 1:

Location: Left; Mid

Maximum size: 0.7 cm; Other 2 dimensions: 0.4 x 0.5 cm

Composition: solid/almost completely solid (2)

Echogenicity: hypoechoic (2)

Shape: not taller-than-wide (0)

Margins: smooth (0)

Echogenic foci: none (0)

ACR TI-RADS total points: 4.

ACR TI-RADS risk category: TR4 (4-6 points).

ACR TI-RADS recommendations:

Given size (<0.9 cm) and appearance, this nodule does NOT meet
TI-RADS criteria for biopsy or dedicated follow-up.

_________________________________________________________

No right thyroid nodules.
IMPRESSION: Solitary left thyroid nodule measuring up to 0.7 cm. This nodule
does not meet criteria for biopsy or dedicated follow-up.

The above is in keeping with the ACR TI-RADS recommendations - [HOSPITAL] 0891;[DATE].
